# Patient Record
Sex: Female | Born: 2005 | Hispanic: Yes | Marital: Single | Smoking: Never smoker
Health system: Southern US, Community
[De-identification: ages and names within clinical notes are randomized; demographics above are authoritative.]

---
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
## 2023-10-19 ENCOUNTER — Emergency Department
Admission: EM | Admit: 2023-10-19 | Discharge: 2023-10-19 | Disposition: A | Discharge status: Home / Self Care | Attending: Emergency Medicine | Admitting: Emergency Medicine

## 2023-10-19 ENCOUNTER — Emergency Department

## 2023-10-19 ENCOUNTER — Other Ambulatory Visit: Payer: Self-pay

## 2023-10-19 DIAGNOSIS — K2900 Acute gastritis without bleeding: Secondary | ICD-10-CM | POA: Insufficient documentation

## 2023-10-19 DIAGNOSIS — R1013 Epigastric pain: Secondary | ICD-10-CM | POA: Diagnosis present

## 2023-10-19 LAB — CBC
HCT: 39.8 % (ref 36.0–46.0)
Hemoglobin: 13.5 g/dL (ref 12.0–15.0)
MCH: 30.3 pg (ref 26.0–34.0)
MCHC: 33.9 g/dL (ref 30.0–36.0)
MCV: 89.4 fL (ref 80.0–100.0)
Platelets: 271 10*3/uL (ref 150–400)
RBC: 4.45 MIL/uL (ref 3.87–5.11)
RDW: 12.5 % (ref 11.5–15.5)
WBC: 3 10*3/uL — ABNORMAL LOW (ref 4.0–10.5)
nRBC: 0 % (ref 0.0–0.2)

## 2023-10-19 LAB — COMPREHENSIVE METABOLIC PANEL WITH GFR
ALT: 14 U/L (ref 0–44)
AST: 20 U/L (ref 15–41)
Albumin: 4.1 g/dL (ref 3.5–5.0)
Alkaline Phosphatase: 49 U/L (ref 38–126)
Anion gap: 8 (ref 5–15)
BUN: 12 mg/dL (ref 6–20)
CO2: 24 mmol/L (ref 22–32)
Calcium: 9.1 mg/dL (ref 8.9–10.3)
Chloride: 107 mmol/L (ref 98–111)
Creatinine, Ser: 0.73 mg/dL (ref 0.44–1.00)
GFR, Estimated: >60 mL/min (ref >60)
Glucose, Bld: 99 mg/dL (ref 70–99)
Potassium: 3.4 mmol/L — ABNORMAL LOW (ref 3.5–5.1)
Sodium: 139 mmol/L (ref 135–145)
Total Bilirubin: 0.4 mg/dL (ref 0.0–1.2)
Total Protein: 7.4 g/dL (ref 6.5–8.1)

## 2023-10-19 LAB — URINALYSIS, ROUTINE W REFLEX MICROSCOPIC
Bilirubin Urine: NEGATIVE
Glucose, UA: NEGATIVE mg/dL
Hgb urine dipstick: NEGATIVE
Ketones, ur: 5 mg/dL — AB
Leukocytes,Ua: NEGATIVE
Nitrite: NEGATIVE
Protein, ur: NEGATIVE mg/dL
Specific Gravity, Urine: 1.018 (ref 1.005–1.030)
pH: 7 (ref 5.0–8.0)

## 2023-10-19 LAB — LIPASE, BLOOD: Lipase: 27 U/L (ref 11–51)

## 2023-10-19 LAB — POC URINE PREG, ED: Preg Test, Ur: NEGATIVE

## 2023-10-19 MED ORDER — FAMOTIDINE 20 MG PO TABS: 20.0000 mg | ORAL_TABLET | Freq: Every day | ORAL | 1 refills | Status: AC

## 2023-10-19 MED ORDER — FAMOTIDINE IN NACL 20-0.9 MG/50ML-% IV SOLN
20.0000 mg | Freq: Once | INTRAVENOUS | Status: AC
  Administered 2023-10-19: 20 mg via INTRAVENOUS
  Filled 2023-10-19: qty 50

## 2023-10-19 MED ORDER — SODIUM CHLORIDE 0.9 % IV BOLUS
1000.0000 mL | Freq: Once | INTRAVENOUS | Status: AC
  Administered 2023-10-19: 1000 mL via INTRAVENOUS

## 2023-10-19 MED ORDER — ONDANSETRON HCL 4 MG/2ML IJ SOLN
4.0000 mg | Freq: Once | INTRAMUSCULAR | Status: AC
  Administered 2023-10-19: 4 mg via INTRAVENOUS
  Filled 2023-10-19: qty 2

## 2023-10-19 NOTE — ED Provider Notes (Signed)
 [facility] Provider Note    Event Date/Time   First MD Initiated Contact with Patient 10/19/23 0935     (approximate)   History   Abdominal Pain   HPI  Deborah Andersen is a 18 y.o. female who presents to the ED for evaluation of Abdominal Pain   Reviewed PCP visit from January.  Seen for BV.  No surgical abdominal history.  Patient presents to the ED for evaluation of about 1 week of intermittent epigastric pain, nausea and emesis.  No lower abdominal discomfort, stool changes or urinary changes.  No fevers.  No chest pain.  Reports more severe pain last night and the night prior, emesis and poor sleep.  Reports feeling better now, it was 8/10 pain, now 1/10 pain.  Pain was postprandial, "almost immediately" after eating some sort of cinnamon roll bites these past 2 nights.   Physical Exam   Triage Vital Signs: ED Triage Vitals  Encounter Vitals Group     BP 10/19/23 0930 116/74     Systolic BP Percentile --      Diastolic BP Percentile --      Pulse Rate 10/19/23 0930 92     Resp 10/19/23 0930 19     Temp 10/19/23 0930 98.2 F (36.8 C)     Temp Source 10/19/23 0927 Oral     SpO2 10/19/23 0930 100 %     Weight 10/19/23 0924 106 lb 4.2 oz (48.2 kg)     Height --      Head Circumference --      Peak Flow --      Pain Score 10/19/23 0924 0     Pain Loc --      Pain Education --      Exclude from Growth Chart --     Most recent vital signs: Vitals:   10/19/23 0930 10/19/23 1047  BP: 116/74 105/64  Pulse: 92 84  Resp: 19 16  Temp: 98.2 F (36.8 C)   SpO2: 100% 100%    General: Awake, no distress.  CV:  Good peripheral perfusion.  Resp:  Normal effort.  Abd:  No distention.  Mild epigastric tenderness, otherwise benign abdomen.  No lower abdominal tenderness, no tenderness to McBurney's point MSK:  No deformity noted.  Neuro:  No focal deficits appreciated. Other:     ED Results / Procedures / Treatments   Labs (all labs  ordered are listed, but only abnormal results are displayed) Labs Reviewed  COMPREHENSIVE METABOLIC PANEL WITH GFR - Abnormal; Notable for the following components:      Result Value   Potassium 3.4 (*)    All other components within normal limits  CBC - Abnormal; Notable for the following components:   WBC 3.0 (*)    All other components within normal limits  URINALYSIS, ROUTINE W REFLEX MICROSCOPIC - Abnormal; Notable for the following components:   Color, Urine YELLOW (*)    APPearance CLEAR (*)    Ketones, ur 5 (*)    All other components within normal limits  POC URINE PREG, ED - Normal  LIPASE, BLOOD    EKG Sinus rhythm with a rate of 84 bpm.  Normal axis and intervals.  No clear signs of acute ischemia.  No comparison.  RADIOLOGY RUQ ultrasound interpreted by me without signs of acute pathology  Official radiology report(s): US ABDOMEN LIMITED RUQ (LIVER/GB) Result Date: 10/19/2023 CLINICAL DATA:  Epigastric abdominal pain EXAM: ULTRASOUND ABDOMEN LIMITED RIGHT UPPER  QUADRANT COMPARISON:  None Available. FINDINGS: Gallbladder: No gallstones or wall thickening visualized. No sonographic Murphy sign noted by sonographer. Common bile duct: Diameter: 1 mm Liver: No focal lesion identified. Within normal limits in parenchymal echogenicity. Portal vein is patent on color Doppler imaging with normal direction of blood flow towards the liver. Other: None. IMPRESSION: No gallstones or ductal dilatation Electronically Signed   By: Karen Kays M.D.   On: 10/19/2023 10:57    PROCEDURES and INTERVENTIONS:  Procedures  Medications  famotidine (PEPCID) IVPB 20 mg premix (0 mg Intravenous Stopped 10/19/23 1047)  ondansetron (ZOFRAN) injection 4 mg (4 mg Intravenous Given 10/19/23 1002)  sodium chloride 0.9 % bolus 1,000 mL (1,000 mLs Intravenous New Bag/Given 10/19/23 1001)     IMPRESSION / MDM / ASSESSMENT AND PLAN / ED COURSE  I reviewed the triage vital signs and the nursing  notes.  Differential diagnosis includes, but is not limited to, gastritis or GERD, biliary colic, pancreatitis, cystitis, pregnancy, acute appendicitis  {Patient presents with symptoms of an acute illness or injury that is potentially life-threatening.  Patient presents with intermittent epigastric pain.  I suspect gastric in etiology.  We will obtain an ultrasound to assess for biliary pathology.   Blood work with mild leukopenia but otherwise normal CBC.  Small ketones in the urine suggestive of dehydration for which she receives IV fluids but no infectious features.  Normal lipase and metabolic panel.  Normal ultrasound.  Suspect gastric etiology.  Clinical Course as of 10/19/23 1135  Fri Oct 19, 2023  1133 Reassessed.  Resolution of pain.  Feel much better.  No further emesis.  We discussed likely gastritis etiology of her pain.  Discussed starting an H2 blocker, GI follow-up and ED return precautions. [DS]    Clinical Course User Index [DS] Delton Prairie, MD     FINAL CLINICAL IMPRESSION(S) / ED DIAGNOSES   Final diagnoses:  Acute gastritis without hemorrhage, unspecified gastritis type     Rx / DC Orders   ED Discharge Orders          Ordered    famotidine (PEPCID) 20 MG tablet  Daily        10/19/23 1134             Note:  This document was prepared using Dragon voice recognition software and may include unintentional dictation errors.   Delton Prairie, MD 10/19/23 [phone]

## 2023-10-19 NOTE — ED Triage Notes (Signed)
 Abd pain x 1 week.  Had improved, but worse yesterday morning.   C/O nausea.  Slept poorly due to pain. Also come SOB and chest pain. Now feeling week and left sided numbness.  Denies vomiting, fever, chills.  Tearful in triage  NAD

## 2023-10-19 NOTE — Discharge Instructions (Addendum)
 As we discussed, please use famotidine/Pepcid once daily every day to help reduce acid production in the stomach and prevent these episodes of pain.  Reach out to the GI doctor to see them in the clinic for follow-up and to discuss if an endoscopy is needed  If you develop any worsening symptoms despite these measures, lower abdominal pain, then please return to the ED
